# Patient Record
Sex: Male | Born: 1955 | Race: White | Hispanic: No | Marital: Married | Smoking: Never smoker
Health system: Southern US, Community
[De-identification: ages and names within clinical notes are randomized; demographics above are authoritative.]

## PROBLEM LIST (undated history)

## (undated) DIAGNOSIS — I10 Essential (primary) hypertension: Secondary | ICD-10-CM

## (undated) HISTORY — PX: HERNIA REPAIR: SHX51

---

## 2014-12-28 ENCOUNTER — Other Ambulatory Visit: Payer: Self-pay | Admitting: Internal Medicine

## 2014-12-28 DIAGNOSIS — R93429 Abnormal radiologic findings on diagnostic imaging of unspecified kidney: Secondary | ICD-10-CM

## 2015-01-05 ENCOUNTER — Other Ambulatory Visit: Payer: Self-pay

## 2015-01-10 ENCOUNTER — Ambulatory Visit
Admission: RE | Admit: 2015-01-10 | Discharge: 2015-01-10 | Disposition: A | Discharge status: Home / Self Care | Payer: Managed Care, Other (non HMO) | Source: Ambulatory Visit | Attending: Internal Medicine | Admitting: Internal Medicine

## 2015-01-10 DIAGNOSIS — R93429 Abnormal radiologic findings on diagnostic imaging of unspecified kidney: Secondary | ICD-10-CM

## 2015-01-10 MED ORDER — IOPAMIDOL (ISOVUE-300) INJECTION 61%
100.0000 mL | Freq: Once | INTRAVENOUS | Status: AC | PRN
  Administered 2015-01-10: 100 mL via INTRAVENOUS

## 2016-05-20 ENCOUNTER — Ambulatory Visit: Payer: Self-pay | Admitting: Urology

## 2018-01-24 ENCOUNTER — Emergency Department (HOSPITAL_BASED_OUTPATIENT_CLINIC_OR_DEPARTMENT_OTHER)
Admission: EM | Admit: 2018-01-24 | Discharge: 2018-01-24 | Disposition: A | Discharge status: Home / Self Care | Payer: BLUE CROSS/BLUE SHIELD | Attending: Emergency Medicine | Admitting: Emergency Medicine

## 2018-01-24 ENCOUNTER — Other Ambulatory Visit: Payer: Self-pay

## 2018-01-24 ENCOUNTER — Encounter (HOSPITAL_BASED_OUTPATIENT_CLINIC_OR_DEPARTMENT_OTHER): Payer: Self-pay | Admitting: *Deleted

## 2018-01-24 ENCOUNTER — Emergency Department (HOSPITAL_BASED_OUTPATIENT_CLINIC_OR_DEPARTMENT_OTHER): Payer: BLUE CROSS/BLUE SHIELD

## 2018-01-24 DIAGNOSIS — Y9389 Activity, other specified: Secondary | ICD-10-CM | POA: Insufficient documentation

## 2018-01-24 DIAGNOSIS — S0101XA Laceration without foreign body of scalp, initial encounter: Secondary | ICD-10-CM | POA: Insufficient documentation

## 2018-01-24 DIAGNOSIS — Y999 Unspecified external cause status: Secondary | ICD-10-CM | POA: Diagnosis not present

## 2018-01-24 DIAGNOSIS — Y92009 Unspecified place in unspecified non-institutional (private) residence as the place of occurrence of the external cause: Secondary | ICD-10-CM | POA: Insufficient documentation

## 2018-01-24 DIAGNOSIS — Z7982 Long term (current) use of aspirin: Secondary | ICD-10-CM | POA: Insufficient documentation

## 2018-01-24 DIAGNOSIS — W01198A Fall on same level from slipping, tripping and stumbling with subsequent striking against other object, initial encounter: Secondary | ICD-10-CM | POA: Diagnosis not present

## 2018-01-24 DIAGNOSIS — I10 Essential (primary) hypertension: Secondary | ICD-10-CM | POA: Insufficient documentation

## 2018-01-24 HISTORY — DX: Essential (primary) hypertension: I10

## 2018-01-24 MED ORDER — LIDOCAINE-EPINEPHRINE 1 %-1:100000 IJ SOLN
INTRAMUSCULAR | Status: AC
  Filled 2018-01-24: qty 1

## 2018-01-24 MED ORDER — IBUPROFEN 800 MG PO TABS
800.0000 mg | ORAL_TABLET | Freq: Once | ORAL | Status: AC
  Administered 2018-01-24: 800 mg via ORAL
  Filled 2018-01-24: qty 1

## 2018-01-24 MED ORDER — LIDOCAINE-EPINEPHRINE 2 %-1:100000 IJ SOLN
20.0000 mL | Freq: Once | INTRAMUSCULAR | Status: AC
  Administered 2018-01-24: 20 mL
  Filled 2018-01-24: qty 20

## 2018-01-24 NOTE — ED Notes (Signed)
ED Provider at bedside. 

## 2018-01-24 NOTE — ED Notes (Signed)
Patient transported to CT 

## 2018-01-24 NOTE — ED Triage Notes (Addendum)
Family reports pt fell and hit head on baseboard. Lac present to top of head. Bleeding controlled. Pt smells of etoh, family reports 10+ beers while watching football

## 2018-01-24 NOTE — ED Notes (Signed)
Pt intoxicated. Wife unable to provide daily medication. Denies blood thinner. Reports 81mg  aspirin and blood pressure medication.

## 2018-01-24 NOTE — ED Provider Notes (Signed)
[facility] EMERGENCY DEPARTMENT Provider Note   CSN: [id number] Arrival date & time: 01/24/18  0017     History   Chief Complaint Chief Complaint  Patient presents with  . Head Injury    HPI Tony Bruce is a 62 y.o. male.  Patient presents to the emergency department for evaluation after a fall.  Patient reports that he had a fall tonight.  Patient admits to drinking multiple beers while watching football.  Family reports that he stumbled and fell, hitting his head on the baseboard.  No loss of consciousness.  Patient denies pain currently, but family reports that he has been saying that his head was hurting earlier.     Past Medical History:  Diagnosis Date  . Hypertension     There are no active problems to display for this patient.   Past Surgical History:  Procedure Laterality Date  . HERNIA REPAIR          Home Medications    Prior to Admission medications   Medication Sig Start Date End Date Taking? Authorizing Provider  aspirin 81 MG chewable tablet Chew by mouth daily.   Yes [provider]    Family History No family history on file.  Social History Social History   Tobacco Use  . Smoking status: Never Smoker  . Smokeless tobacco: Never Used  Substance Use Topics  . Alcohol use: Yes  . Drug use: Not on file     Allergies   Penicillins   Review of Systems Review of Systems  Skin: Positive for wound.  Neurological: Positive for headaches.  All other systems reviewed and are negative.    Physical Exam Updated Vital Signs BP (!) 151/117 (BP Location: Right Arm)   Pulse 86   Temp (!) 96.9 F (36.1 C) (Oral)   Resp 18   Ht 6\' 1"  (1.854 m)   Wt 108.9 kg   SpO2 96%   BMI 31.66 kg/m   Physical Exam Vitals signs and nursing note reviewed.  Constitutional:      General: He is not in acute distress.    Appearance: Normal appearance. He is well-developed.  HENT:     Head: Normocephalic. Laceration  present.      Right Ear: Hearing normal.     Left Ear: Hearing normal.     Nose: Nose normal.  Eyes:     Conjunctiva/sclera: Conjunctivae normal.     Pupils: Pupils are equal, round, and reactive to light.  Neck:     Musculoskeletal: Normal range of motion and neck supple.  Cardiovascular:     Rate and Rhythm: Regular rhythm.     Heart sounds: S1 normal and S2 normal. No murmur. No friction rub. No gallop.   Pulmonary:     Effort: Pulmonary effort is normal. No respiratory distress.     Breath sounds: Normal breath sounds.  Chest:     Chest wall: No tenderness.  Abdominal:     General: Bowel sounds are normal.     Palpations: Abdomen is soft.     Tenderness: There is no abdominal tenderness. There is no guarding or rebound. Negative signs include Murphy's sign and McBurney's sign.     Hernia: No hernia is present.  Musculoskeletal: Normal range of motion.  Skin:    General: Skin is warm and dry.     Findings: Laceration present. No rash.  Neurological:     Mental Status: He is alert and oriented to person, place, and time.  GCS: GCS eye subscore is 4. GCS verbal subscore is 5. GCS motor subscore is 6.     Cranial Nerves: No cranial nerve deficit.     Sensory: No sensory deficit.     Coordination: Coordination normal.  Psychiatric:        Speech: Speech normal.        Behavior: Behavior normal.        Thought Content: Thought content normal.      ED Treatments / Results  Labs (all labs ordered are listed, but only abnormal results are displayed) Labs Reviewed - No data to display  EKG None  Radiology Ct Head Wo Contrast  Result Date: 01/24/2018 CLINICAL DATA:  Status post fall. Hit head on baseboard. Laceration at the top of the head. Concern for cervical spine injury. Initial encounter. EXAM: CT HEAD WITHOUT CONTRAST CT CERVICAL SPINE WITHOUT CONTRAST TECHNIQUE: Multidetector CT imaging of the head and cervical spine was performed following the standard  protocol without intravenous contrast. Multiplanar CT image reconstructions of the cervical spine were also generated. COMPARISON:  None. FINDINGS: CT HEAD FINDINGS Brain: No evidence of acute infarction, hemorrhage, hydrocephalus, extra-axial collection or mass lesion/mass effect. Slight apparent asymmetry of gray-white differentiation is thought to reflect positioning. The posterior fossa, including the cerebellum, brainstem and fourth ventricle, is within normal limits. The third and lateral ventricles, and basal ganglia are unremarkable in appearance. The cerebral hemispheres demonstrate grossly normal gray-white differentiation. No mass effect or midline shift is seen. Vascular: No hyperdense vessel or unexpected calcification. Skull: There is no evidence of fracture; visualized osseous structures are unremarkable in appearance. Sinuses/Orbits: The orbits are within normal limits. Mucosal thickening is noted at the maxillary sinuses bilaterally, and there is partial opacification of the ethmoid air cells. The remaining paranasal sinuses and mastoid air cells are well-aerated. Other: Soft tissue swelling is noted at the right vertex. CT CERVICAL SPINE FINDINGS Alignment: Normal. Skull base and vertebrae: No acute fracture. There is incomplete fusion of the posterior arch of C1. No primary bone lesion or focal pathologic process. Soft tissues and spinal canal: No prevertebral fluid or swelling. No visible canal hematoma. Disc levels: Intervertebral disc space narrowing is noted at C6-C7. The underlying bony foramina are grossly unremarkable in appearance. Upper chest: The minimally visualized lung apices are clear. The thyroid gland is unremarkable. Other: No additional soft tissue abnormalities are seen. IMPRESSION: 1. No evidence of traumatic intracranial injury or fracture. 2. No evidence of fracture or subluxation along the cervical spine. 3. Soft tissue swelling at the right vertex. 4. Mucosal thickening at  the maxillary sinuses bilaterally. Electronically Signed   By: Roanna RaiderJeffery  Chang M.D.   On: 01/24/2018 01:12   Ct Cervical Spine Wo Contrast  Result Date: 01/24/2018 CLINICAL DATA:  Status post fall. Hit head on baseboard. Laceration at the top of the head. Concern for cervical spine injury. Initial encounter. EXAM: CT HEAD WITHOUT CONTRAST CT CERVICAL SPINE WITHOUT CONTRAST TECHNIQUE: Multidetector CT imaging of the head and cervical spine was performed following the standard protocol without intravenous contrast. Multiplanar CT image reconstructions of the cervical spine were also generated. COMPARISON:  None. FINDINGS: CT HEAD FINDINGS Brain: No evidence of acute infarction, hemorrhage, hydrocephalus, extra-axial collection or mass lesion/mass effect. Slight apparent asymmetry of gray-white differentiation is thought to reflect positioning. The posterior fossa, including the cerebellum, brainstem and fourth ventricle, is within normal limits. The third and lateral ventricles, and basal ganglia are unremarkable in appearance. The cerebral hemispheres demonstrate grossly normal  gray-white differentiation. No mass effect or midline shift is seen. Vascular: No hyperdense vessel or unexpected calcification. Skull: There is no evidence of fracture; visualized osseous structures are unremarkable in appearance. Sinuses/Orbits: The orbits are within normal limits. Mucosal thickening is noted at the maxillary sinuses bilaterally, and there is partial opacification of the ethmoid air cells. The remaining paranasal sinuses and mastoid air cells are well-aerated. Other: Soft tissue swelling is noted at the right vertex. CT CERVICAL SPINE FINDINGS Alignment: Normal. Skull base and vertebrae: No acute fracture. There is incomplete fusion of the posterior arch of C1. No primary bone lesion or focal pathologic process. Soft tissues and spinal canal: No prevertebral fluid or swelling. No visible canal hematoma. Disc levels:  Intervertebral disc space narrowing is noted at C6-C7. The underlying bony foramina are grossly unremarkable in appearance. Upper chest: The minimally visualized lung apices are clear. The thyroid gland is unremarkable. Other: No additional soft tissue abnormalities are seen. IMPRESSION: 1. No evidence of traumatic intracranial injury or fracture. 2. No evidence of fracture or subluxation along the cervical spine. 3. Soft tissue swelling at the right vertex. 4. Mucosal thickening at the maxillary sinuses bilaterally. Electronically Signed   By: Roanna Raider M.D.   On: 01/24/2018 01:12    Procedures .Marland KitchenLaceration Repair Date/Time: 01/24/2018 1:28 AM Performed by: Gilda Crease, MD Authorized by: Gilda Crease, MD   Consent:    Consent obtained:  Verbal   Consent given by:  Patient   Risks discussed:  Infection and pain Universal protocol:    Procedure explained and questions answered to patient or proxy's satisfaction: yes     Relevant documents present and verified: yes     Test results available and properly labeled: yes     Imaging studies available: yes     Required blood products, implants, devices, and special equipment available: yes     Site/side marked: yes     Immediately prior to procedure, a time out was called: yes     Patient identity confirmed:  Verbally with patient Anesthesia (see MAR for exact dosages):    Anesthesia method:  Local infiltration   Local anesthetic:  Lidocaine 2% WITH epi Laceration details:    Location:  Scalp   Length (cm):  1.5 Repair type:    Repair type:  Simple Pre-procedure details:    Preparation:  Patient was prepped and draped in usual sterile fashion and imaging obtained to evaluate for foreign bodies Exploration:    Hemostasis achieved with:  Epinephrine   Contaminated: no   Treatment:    Area cleansed with:  Betadine   Irrigation solution:  Sterile saline Skin repair:    Repair method:  Staples   Number of  staples:  3 Approximation:    Approximation:  Close Post-procedure details:    Dressing:  Open (no dressing)   (including critical care time)  Medications Ordered in ED Medications  lidocaine-EPINEPHrine (XYLOCAINE W/EPI) 2 %-1:100000 (with pres) injection 20 mL (has no administration in time range)  lidocaine-EPINEPHrine (XYLOCAINE W/EPI) 1 %-1:100000 (with pres) injection (has no administration in time range)  ibuprofen (ADVIL,MOTRIN) tablet 800 mg (800 mg Oral Given 01/24/18 0047)     Initial Impression / Assessment and Plan / ED Course  I have reviewed the triage vital signs and the nursing notes.  Pertinent labs & imaging results that were available during my care of the patient were reviewed by me and considered in my medical decision making (see chart for details).  Presents to the emergency department for evaluation after a fall.  Patient is intoxicated, has been drinking while watching football earlier tonight.  This is because of the fall.  There was no loss of consciousness.  Patient is awake and alert but obviously impaired and therefore underwent CT scan of head and cervical spine.  No abnormality noted.  Patient does not have any injury elsewhere.  Staples placed in the scalp, staple removal in 7 to 10 days.  Final Clinical Impressions(s) / ED Diagnoses   Final diagnoses:  Scalp laceration, initial encounter    ED Discharge Orders    None       Lasonia Casino, Canary Brim, MD 01/24/18 0130

## 2018-01-24 NOTE — ED Notes (Signed)
pts wife and family member understood dc material. NAD noted.

## 2018-01-24 NOTE — Discharge Instructions (Addendum)
Staples should be removed in 7 to 10 days. °

## 2019-08-08 IMAGING — CT CT CERVICAL SPINE W/O CM
4 of 7 series · 13 of 33 positions shown, 14 images · non-contrast
Comparison: None.

CLINICAL DATA: Status post fall. Hit head on baseboard. Laceration
at the top of the head. Concern for cervical spine injury. Initial
encounter.

EXAM:
CT HEAD WITHOUT CONTRAST
CT CERVICAL SPINE WITHOUT CONTRAST
TECHNIQUE: Multidetector CT imaging of the head and cervical spine was
performed following the standard protocol without intravenous
contrast. Multiplanar CT image reconstructions of the cervical spine
were also generated.

[Series 7: c_spine 2.0 i30s 3 · axial · 0.31mm/px · z∈[+912,+1022]mm · 4 of 93 slices shown, 5 images]
[im 19/93  soft-tissue]
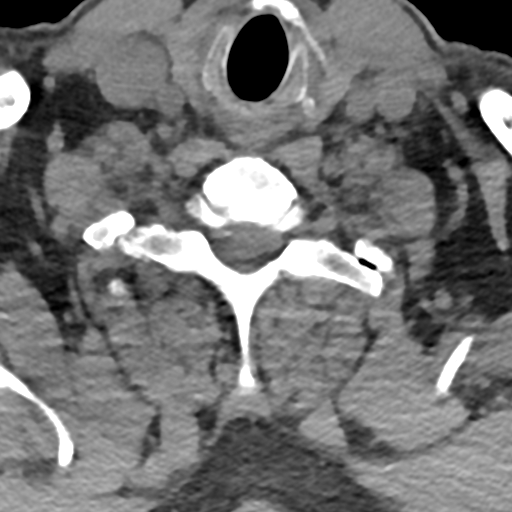
[im 19/93  bone]
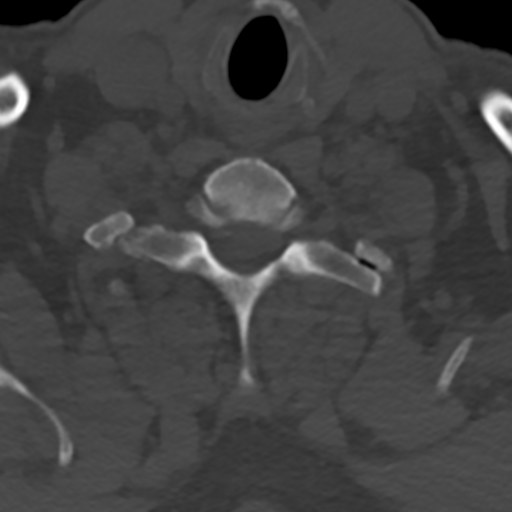
[im 37/93  bone]
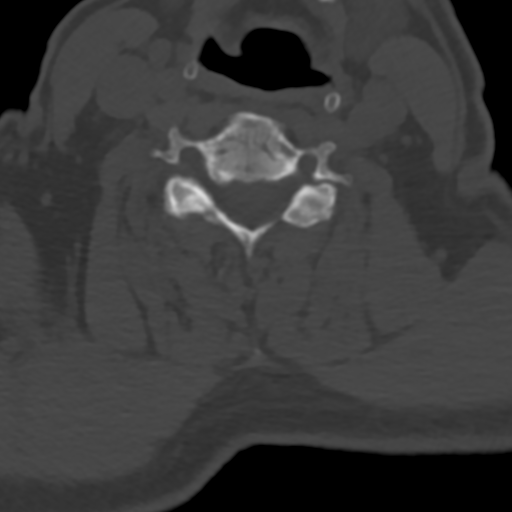
[im 56/93  bone]
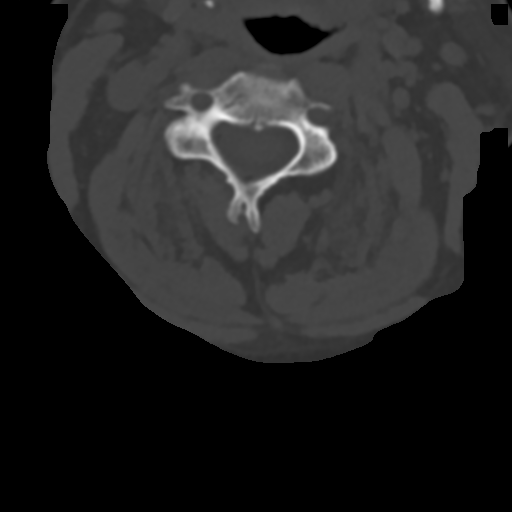
[im 74/93  bone]
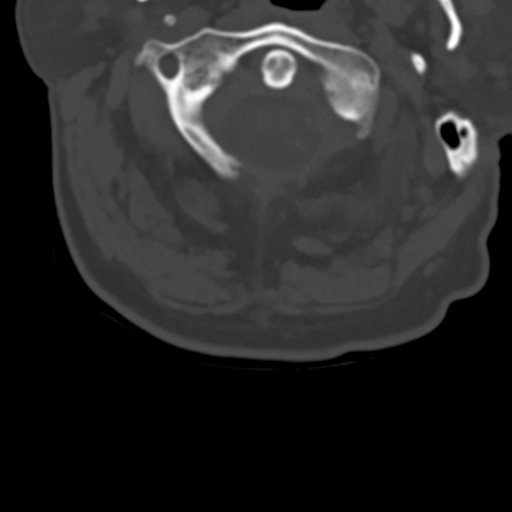

[Series 9: coronals · coronal · 0.27mm/px · 1 of 61 slices shown]
[im 31/61  bone]
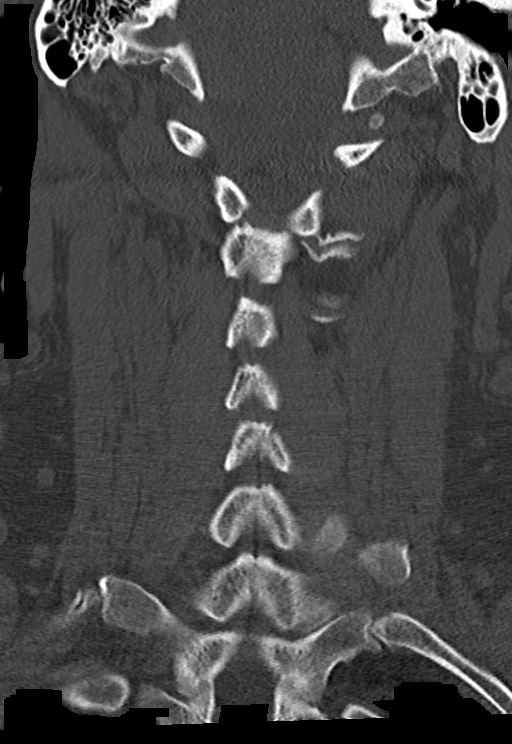

[Series 10: sagittals · sagittal · 0.25mm/px · 5 of 61 slices shown]
[im 11/61  bone]
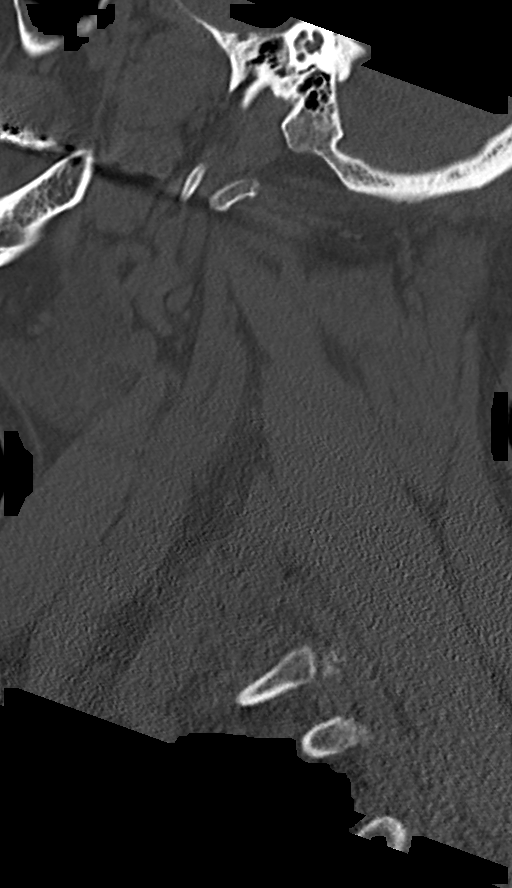
[im 21/61  bone]
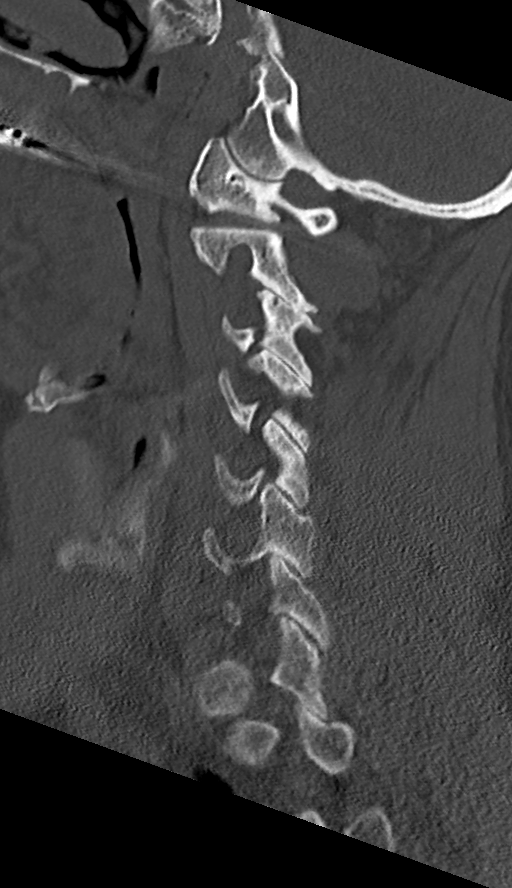
[im 31/61  bone]
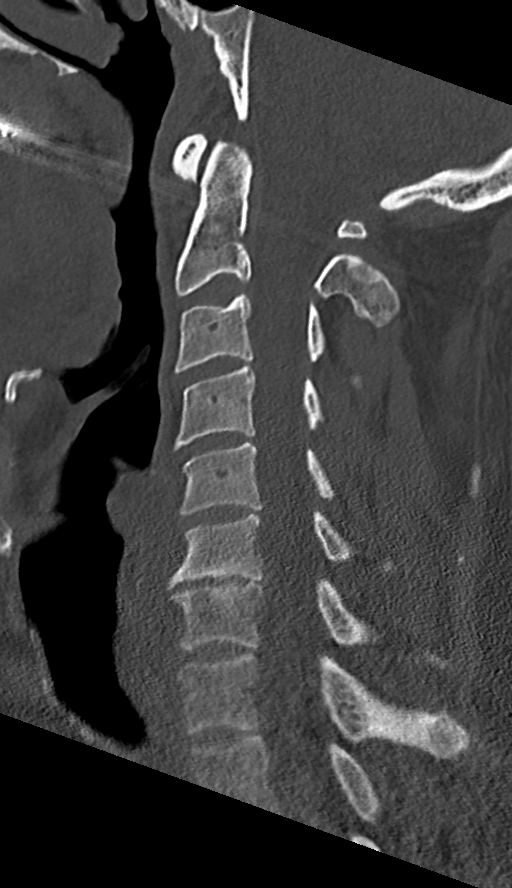
[im 41/61  bone]
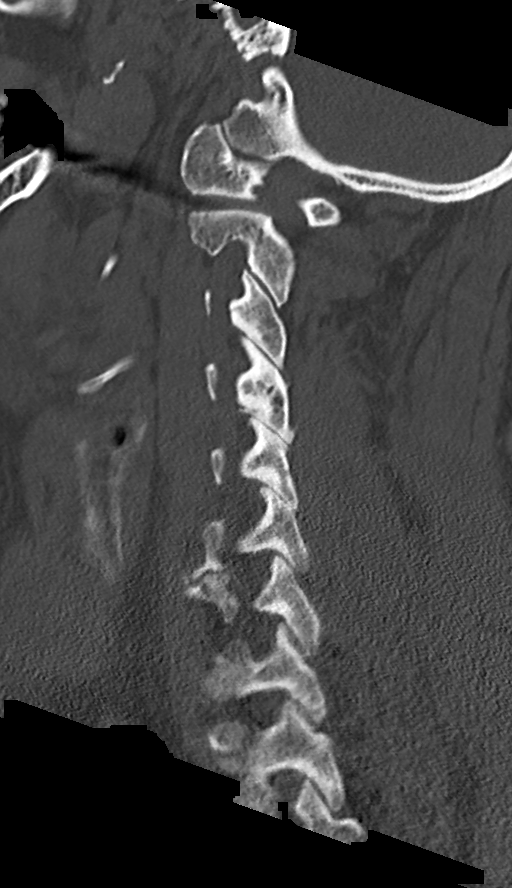
[im 51/61  bone]
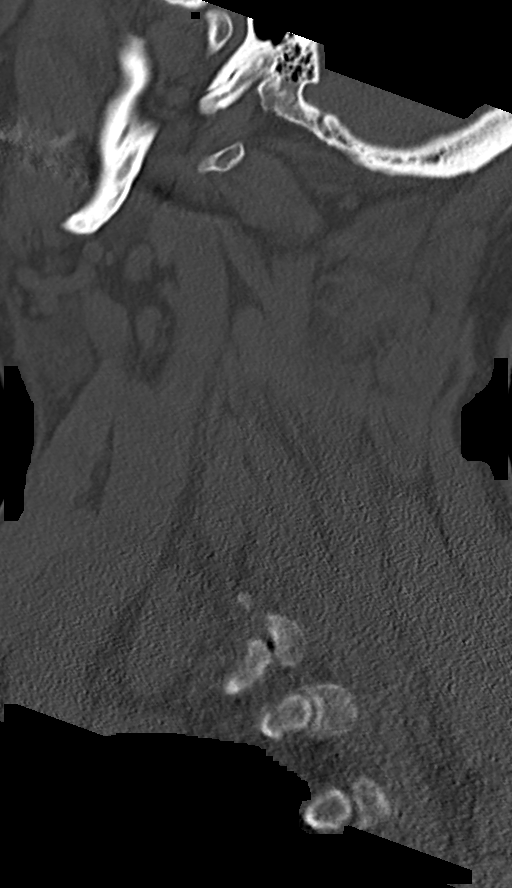

[Series 11: orthogonals · axial · 0.23mm/px · z∈[+883,+958]mm · 3 of 101 slices shown]
[im 21/101  bone]
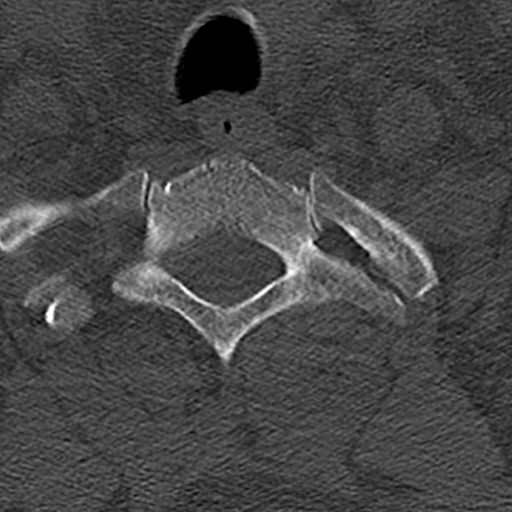
[im 41/101  bone]
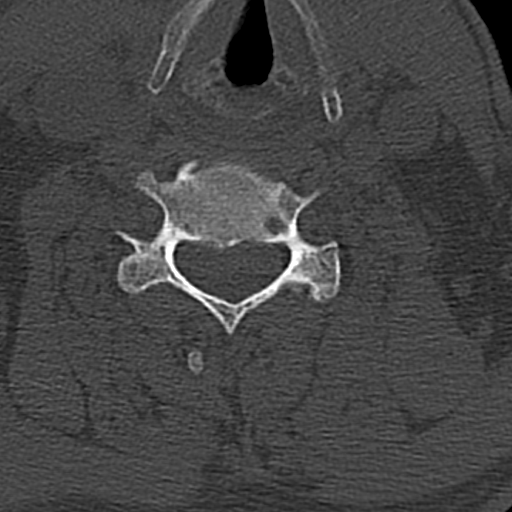
[im 61/101  bone]
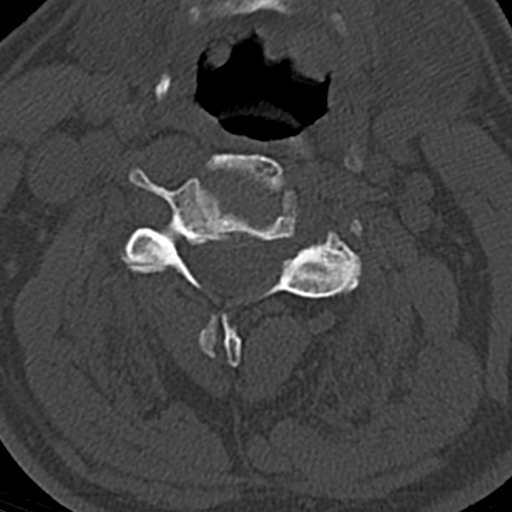

[13 of 33 positions shown; findings below may reference images not displayed]

FINDINGS: CT HEAD FINDINGS

Brain: No evidence of acute infarction, hemorrhage, hydrocephalus,
extra-axial collection or mass lesion/mass effect. Slight apparent
asymmetry of gray-white differentiation is thought to reflect
positioning.

The posterior fossa, including the cerebellum, brainstem and fourth
ventricle, is within normal limits. The third and lateral
ventricles, and basal ganglia are unremarkable in appearance. The
cerebral hemispheres demonstrate grossly normal gray-white
differentiation. No mass effect or midline shift is seen.

Vascular: No hyperdense vessel or unexpected calcification.

Skull: There is no evidence of fracture; visualized osseous
structures are unremarkable in appearance.

Sinuses/Orbits: The orbits are within normal limits. Mucosal
thickening is noted at the maxillary sinuses bilaterally, and there
is partial opacification of the ethmoid air cells. The remaining
paranasal sinuses and mastoid air cells are well-aerated.

Other: Soft tissue swelling is noted at the right vertex.

CT CERVICAL SPINE FINDINGS

Alignment: Normal.

Skull base and vertebrae: No acute fracture. There is incomplete
fusion of the posterior arch of C1. No primary bone lesion or focal
pathologic process.

Soft tissues and spinal canal: No prevertebral fluid or swelling. No
visible canal hematoma.

Disc levels: Intervertebral disc space narrowing is noted at C6-C7.
The underlying bony foramina are grossly unremarkable in appearance.

Upper chest: The minimally visualized lung apices are clear. The
thyroid gland is unremarkable.

Other: No additional soft tissue abnormalities are seen.
IMPRESSION: 1. No evidence of traumatic intracranial injury or fracture.
2. No evidence of fracture or subluxation along the cervical spine.
3. Soft tissue swelling at the right vertex.
4. Mucosal thickening at the maxillary sinuses bilaterally.
